# Patient Record
Sex: Male | Born: 1995 | Race: White | Hispanic: No | Marital: Single | Smoking: Current every day smoker
Health system: Southern US, Community
[De-identification: ages and names within clinical notes are randomized; demographics above are authoritative.]

## PROBLEM LIST (undated history)

## (undated) ENCOUNTER — Emergency Department (HOSPITAL_COMMUNITY): Admission: EM | Payer: Managed Care, Other (non HMO) | Source: Home / Self Care

## (undated) DIAGNOSIS — R4689 Other symptoms and signs involving appearance and behavior: Secondary | ICD-10-CM

## (undated) DIAGNOSIS — F909 Attention-deficit hyperactivity disorder, unspecified type: Secondary | ICD-10-CM

## (undated) DIAGNOSIS — R4589 Other symptoms and signs involving emotional state: Secondary | ICD-10-CM

## (undated) DIAGNOSIS — F913 Oppositional defiant disorder: Secondary | ICD-10-CM

## (undated) DIAGNOSIS — J45909 Unspecified asthma, uncomplicated: Secondary | ICD-10-CM

## (undated) HISTORY — DX: Attention-deficit hyperactivity disorder, unspecified type: F90.9

## (undated) HISTORY — PX: OTHER SURGICAL HISTORY: SHX169

## (undated) HISTORY — PX: WRIST SURGERY: SHX841

---
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
## 2017-01-08 ENCOUNTER — Telehealth: Payer: Self-pay | Admitting: Internal Medicine

## 2017-01-08 NOTE — Telephone Encounter (Signed)
Mom called because pt was charged with the  amphetamine-dextroamphetamine (ADDERALL) 10 MG tablet  being in his pocket and not in a labeled bottle.   Pt needs a letter from the dr stating pt is on the adderall and he is allowed to have. Pt still  incarcerated going on 3 weeks, but the adderall was just one problem.  Pt was also charged with missing a court date. But the letter from the dr will help with pt's case.   Please call and mom will pick up.  Pt has court date 01/18/17

## 2017-01-08 NOTE — Telephone Encounter (Signed)
See message below, please advise.

## 2017-01-09 ENCOUNTER — Encounter: Payer: Self-pay | Admitting: Internal Medicine

## 2017-01-09 NOTE — Telephone Encounter (Signed)
Letter dictating okay for her mother to pick up

## 2017-01-09 NOTE — Telephone Encounter (Signed)
Called mother of pt so that she can stop by the office to pick It up.

## 2017-01-12 ENCOUNTER — Ambulatory Visit: Payer: Self-pay | Admitting: Internal Medicine

## 2017-02-15 ENCOUNTER — Telehealth: Payer: Self-pay | Admitting: Internal Medicine

## 2017-02-15 NOTE — Telephone Encounter (Signed)
Pt must schedule an appointment for further refills

## 2017-02-15 NOTE — Telephone Encounter (Signed)
Pt just got out of jail today and girlfriend called requesting refill for  amphetamine-dextroamphetamine (ADDERALL XR) 30 MG 24 hr capsule amphetamine-dextroamphetamine (ADDERALL) 10 MG tablet  Amber, girlfriend states pt will be the one picking up.

## 2017-02-16 ENCOUNTER — Ambulatory Visit: Payer: Medicaid Other | Admitting: Family Medicine

## 2017-02-16 NOTE — Telephone Encounter (Signed)
Pt states he needs this med asap.  appt scheduled with Dr Caryl NeverBurchette this afternoon.

## 2017-02-19 NOTE — Telephone Encounter (Signed)
Pt was late and had to cancel appointment with Dr Caryl NeverBurchette.

## 2017-02-20 ENCOUNTER — Ambulatory Visit: Payer: Self-pay | Admitting: Family Medicine

## 2017-02-20 ENCOUNTER — Ambulatory Visit: Payer: 59 | Admitting: Family Medicine

## 2017-03-06 ENCOUNTER — Ambulatory Visit: Payer: 59 | Admitting: Internal Medicine

## 2017-03-09 ENCOUNTER — Ambulatory Visit: Payer: 59 | Admitting: Internal Medicine

## 2017-03-15 ENCOUNTER — Telehealth: Payer: Self-pay | Admitting: *Deleted

## 2017-03-15 NOTE — Telephone Encounter (Signed)
Left message for return call to office to discuss no show policy and possible dismissal from practice.

## 2017-03-27 ENCOUNTER — Encounter: Payer: Self-pay | Admitting: *Deleted

## 2017-03-27 NOTE — Progress Notes (Signed)
Multiple calls made to number listed for patient, message left with family member for return call to office; no return calls; MD requests patient to be dismissed from practice. Letter mailed to patient.

## 2017-05-25 DIAGNOSIS — Z5321 Procedure and treatment not carried out due to patient leaving prior to being seen by health care provider: Secondary | ICD-10-CM | POA: Diagnosis not present

## 2017-05-25 DIAGNOSIS — M79603 Pain in arm, unspecified: Secondary | ICD-10-CM | POA: Diagnosis present

## 2017-05-25 NOTE — ED Triage Notes (Signed)
Reports shooting up meth yesterday and having pain all over since.  States the pain moves all over and i'm swelling.  No swelling noted.  Small bruise noted to left ac at injection site.  Also states its been hard to pee as well since yesterday.  Reports as burning.

## 2017-05-26 ENCOUNTER — Emergency Department (HOSPITAL_COMMUNITY)
Admission: EM | Admit: 2017-05-26 | Discharge: 2017-05-26 | Disposition: A | Discharge status: Home / Self Care | Payer: 59 | Attending: Emergency Medicine | Admitting: Emergency Medicine

## 2017-05-26 NOTE — ED Notes (Addendum)
Pt called for A2 but no answer in waiting room. Pt moved back to waiting room.

## 2017-07-10 ENCOUNTER — Emergency Department (HOSPITAL_COMMUNITY)
Admission: EM | Admit: 2017-07-10 | Discharge: 2017-07-12 | Disposition: A | Discharge status: Home / Self Care | Payer: 59 | Attending: Emergency Medicine | Admitting: Emergency Medicine

## 2017-07-10 ENCOUNTER — Encounter (HOSPITAL_COMMUNITY): Payer: Self-pay

## 2017-07-10 DIAGNOSIS — Z046 Encounter for general psychiatric examination, requested by authority: Secondary | ICD-10-CM | POA: Insufficient documentation

## 2017-07-10 DIAGNOSIS — F17200 Nicotine dependence, unspecified, uncomplicated: Secondary | ICD-10-CM | POA: Diagnosis not present

## 2017-07-10 DIAGNOSIS — Z813 Family history of other psychoactive substance abuse and dependence: Secondary | ICD-10-CM | POA: Diagnosis not present

## 2017-07-10 DIAGNOSIS — J45909 Unspecified asthma, uncomplicated: Secondary | ICD-10-CM | POA: Insufficient documentation

## 2017-07-10 DIAGNOSIS — F6381 Intermittent explosive disorder: Secondary | ICD-10-CM | POA: Diagnosis not present

## 2017-07-10 DIAGNOSIS — F909 Attention-deficit hyperactivity disorder, unspecified type: Secondary | ICD-10-CM | POA: Insufficient documentation

## 2017-07-10 DIAGNOSIS — F913 Oppositional defiant disorder: Secondary | ICD-10-CM | POA: Diagnosis present

## 2017-07-10 DIAGNOSIS — F1220 Cannabis dependence, uncomplicated: Secondary | ICD-10-CM | POA: Diagnosis not present

## 2017-07-10 DIAGNOSIS — F1994 Other psychoactive substance use, unspecified with psychoactive substance-induced mood disorder: Secondary | ICD-10-CM | POA: Diagnosis not present

## 2017-07-10 LAB — SALICYLATE LEVEL: Salicylate Lvl: <7 mg/dL (ref 2.8–30.0)

## 2017-07-10 LAB — COMPREHENSIVE METABOLIC PANEL
ALK PHOS: 93 U/L (ref 38–126)
ALT: 40 U/L (ref 17–63)
AST: 35 U/L (ref 15–41)
Albumin: 4.7 g/dL (ref 3.5–5.0)
Anion gap: 9 (ref 5–15)
BUN: 8 mg/dL (ref 6–20)
CALCIUM: 9.3 mg/dL (ref 8.9–10.3)
CO2: 28 mmol/L (ref 22–32)
Chloride: 104 mmol/L (ref 101–111)
Creatinine, Ser: 1.11 mg/dL (ref 0.61–1.24)
GFR calc Af Amer: >60 mL/min (ref >60)
GFR calc non Af Amer: >60 mL/min (ref >60)
GLUCOSE: 99 mg/dL (ref 65–99)
Potassium: 3.6 mmol/L (ref 3.5–5.1)
SODIUM: 141 mmol/L (ref 135–145)
Total Bilirubin: 0.6 mg/dL (ref 0.3–1.2)
Total Protein: 7.9 g/dL (ref 6.5–8.1)

## 2017-07-10 LAB — CBC
HEMATOCRIT: 46.6 % (ref 39.0–52.0)
Hemoglobin: 16.2 g/dL (ref 13.0–17.0)
MCH: 32.1 pg (ref 26.0–34.0)
MCHC: 34.8 g/dL (ref 30.0–36.0)
MCV: 92.3 fL (ref 78.0–100.0)
Platelets: 221 10*3/uL (ref 150–400)
RBC: 5.05 MIL/uL (ref 4.22–5.81)
RDW: 13.9 % (ref 11.5–15.5)
WBC: 9.2 10*3/uL (ref 4.0–10.5)

## 2017-07-10 LAB — ETHANOL: Alcohol, Ethyl (B): <5 mg/dL (ref <5)

## 2017-07-10 LAB — RAPID URINE DRUG SCREEN, HOSP PERFORMED
Amphetamines: NOT DETECTED
BARBITURATES: NOT DETECTED
BENZODIAZEPINES: NOT DETECTED
Cocaine: NOT DETECTED
Opiates: NOT DETECTED
TETRAHYDROCANNABINOL: POSITIVE — AB

## 2017-07-10 LAB — ACETAMINOPHEN LEVEL: Acetaminophen (Tylenol), Serum: <10 ug/mL — ABNORMAL LOW (ref 10–30)

## 2017-07-10 MED ORDER — IBUPROFEN 200 MG PO TABS: 600.0000 mg | ORAL_TABLET | Freq: Three times a day (TID) | ORAL | Status: DC | PRN

## 2017-07-10 MED ORDER — NICOTINE 21 MG/24HR TD PT24
21.0000 mg | MEDICATED_PATCH | Freq: Every day | TRANSDERMAL | Status: DC
  Administered 2017-07-10 – 2017-07-12 (×3): 21 mg via TRANSDERMAL
  Filled 2017-07-10 (×3): qty 1

## 2017-07-10 MED ORDER — ALUM & MAG HYDROXIDE-SIMETH 200-200-20 MG/5ML PO SUSP: 30.0000 mL | Freq: Four times a day (QID) | ORAL | Status: DC | PRN
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
## 2017-07-10 NOTE — ED Notes (Signed)
Patient is calm and cooperative-Sheriff Deputy states the patient told him he was depressed. Patient given water and apple juice, declines offer of sandwich.
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
## 2017-07-11 ENCOUNTER — Encounter (HOSPITAL_COMMUNITY): Payer: Self-pay | Admitting: Emergency Medicine

## 2017-07-11 DIAGNOSIS — F1220 Cannabis dependence, uncomplicated: Secondary | ICD-10-CM | POA: Diagnosis present

## 2017-07-11 DIAGNOSIS — F1994 Other psychoactive substance use, unspecified with psychoactive substance-induced mood disorder: Secondary | ICD-10-CM | POA: Diagnosis present

## 2017-07-11 DIAGNOSIS — F6381 Intermittent explosive disorder: Secondary | ICD-10-CM | POA: Diagnosis not present

## 2017-07-11 MED ORDER — GABAPENTIN 300 MG PO CAPS
300.0000 mg | ORAL_CAPSULE | Freq: Two times a day (BID) | ORAL | Status: DC
  Administered 2017-07-11 – 2017-07-12 (×3): 300 mg via ORAL
  Filled 2017-07-11 (×3): qty 1

## 2017-07-11 NOTE — ED Notes (Signed)
Bed: [id number] Expected date:  Expected time:  Means of arrival:  Comments: [facility]er PickerelHall D

## 2017-07-11 NOTE — ED Notes (Signed)
Patient is resting comfortably. 

## 2017-07-11 NOTE — ED Notes (Signed)
Pt denies SI/HI/AVH. Pt complaint with medication regimen. Encouragement and support provided. Special checks q 15 mins in place for safety, Video monitoring in place. Will continue to monitor.

## 2017-07-11 NOTE — ED Notes (Signed)
Pt father at bedside visiting with pt.  

## 2017-07-11 NOTE — ED Notes (Signed)
TTS in progress 

## 2017-07-11 NOTE — ED Notes (Signed)
Patient denies SI, HI and AVH at this time. Plan of care discussed with patient. Patient voices no complaints or concerns at this time. Encouragement and support provided and safety maintain. Q 15 min safety checks in place and video monitoring. 
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
## 2017-07-11 NOTE — ED Notes (Signed)
Transported to [facility]-patient remains calm and cooperative

## 2017-07-11 NOTE — ED Notes (Signed)
Patient in bathroom changing clothes-security called to wand patient and belongings

## 2017-07-12 DIAGNOSIS — F17210 Nicotine dependence, cigarettes, uncomplicated: Secondary | ICD-10-CM | POA: Diagnosis not present

## 2017-07-12 DIAGNOSIS — Z813 Family history of other psychoactive substance abuse and dependence: Secondary | ICD-10-CM

## 2017-07-12 DIAGNOSIS — F6381 Intermittent explosive disorder: Secondary | ICD-10-CM

## 2017-07-12 DIAGNOSIS — F1994 Other psychoactive substance use, unspecified with psychoactive substance-induced mood disorder: Secondary | ICD-10-CM

## 2017-07-12 DIAGNOSIS — R4587 Impulsiveness: Secondary | ICD-10-CM

## 2017-07-12 DIAGNOSIS — F1220 Cannabis dependence, uncomplicated: Secondary | ICD-10-CM | POA: Diagnosis not present

## 2017-07-12 MED ORDER — HYDROCORTISONE 1 % EX CREA
TOPICAL_CREAM | CUTANEOUS | Status: DC | PRN
Start: 2017-07-12 — End: 2017-07-12
  Filled 2017-07-12: qty 28

## 2017-07-12 NOTE — ED Notes (Signed)
Pt d/c home per MD order. Dsicharge summary reviewed with pt. Pt verbalizes understanding.. Pt denies SI/HI/AVH. Personal property returned to pt. Pt ambulatory off unit.

## 2017-07-12 NOTE — ED Notes (Signed)
Pt c/o itching at left foot. Small red area noted, this nurse notified Elta GuadeloupeLaurie Parks NP

## 2017-07-12 NOTE — Consult Note (Signed)
[place] Psychiatry Consult   Reason for Consult:  Aggressive behavior Referring Physician:  EDP Patient Identification: Tony [name] MRN:  [id number] Principal Diagnosis: Intermittent explosive disorder Diagnosis:   Patient Active Problem List   Diagnosis Date Noted  . Intermittent explosive disorder [F63.81] 07/11/2017  . Cannabis use disorder, severe, dependence ([place]) [F12.20] 07/11/2017  . Substance induced mood disorder ([place]) [F19.94] 07/11/2017  . Attention deficit disorder (ADD), child, with hyperactivity [F90.9] 05/02/2011    Total Time spent with patient: 30 minutes  Subjective:   Tony [name] is a 21 y.o. male patient admitted under IVC for aggressive behavior.  HPI:  Pt was seen by Dr Darleene Cleaver and this clinician. Pt spent the night in [facility] without incident. Pt was calm and cooperative, alert & oriented x 4, and appropriate for the situation.  Pt denies suicidal/homicidal ideation, denies auditory/visual hallucinations and does not appear to be responding to internal stimuli. Pt stated he feels good today and was able to contract for safety. Pt is psychiatrically cleared for discharge.   Past Psychiatric History: As above  Risk to Self: None Risk to Others: None Prior Inpatient Therapy: Prior Inpatient Therapy: No Prior Outpatient Therapy: Prior Outpatient Therapy: No Does patient have an ACCT team?: No Does patient have Intensive In-House Services?  : No Does patient have [facility] services? : No Does patient have P4CC services?: No  Past Medical History:  Past Medical History:  Diagnosis Date  . ADHD (attention deficit hyperactivity disorder)   . Asthma   . Attention deficit disorder   . Oppositional defiant disorder   . Suicidal behavior     Past Surgical History:  Procedure Laterality Date  . wisdom teeth removal     . WRIST SURGERY     Family History:  Family History  Problem Relation Age of Onset  . Drug abuse Mother    Family  Psychiatric  History: Unknown Social History:  History  Alcohol Use No     History  Drug Use  . Types: IV, Methamphetamines    Social History   Social History  . Marital status: Single    Spouse name: N/A  . Number of children: N/A  . Years of education: N/A   Social History Main Topics  . Smoking status: Current Every Day Smoker    Packs/day: 0.50    Last attempt to quit: 11/27/2014  . Smokeless tobacco: Never Used  . Alcohol use No  . Drug use: Yes    Types: IV, Methamphetamines  . Sexual activity: No   Other Topics Concern  . None   Social History Narrative  . None   Additional Social History:    Allergies:   Allergies  Allergen Reactions  . Penicillins Anaphylaxis    Has patient had a PCN reaction causing immediate rash, facial/tongue/throat swelling, SOB or lightheadedness with hypotension: YES Has patient had a PCN reaction causing severe rash involving mucus membranes or skin necrosis: NO Has patient had a PCN reaction that required hospitalization NO Has patient had a PCN reaction occurring within the last 10 years: NO If all of the above answers are "NO", then may proceed with Cephalosporin use.  Marland Kitchen Amoxicillin Nausea And Vomiting  . Other     pollen    Labs:  Results for orders placed or performed during the hospital encounter of 07/10/17 (from the past 48 hour(s))  Rapid urine drug screen (hospital performed)     Status: Abnormal   Collection Time: 07/10/17 10:38 PM  Result Value Ref Range   Opiates NONE DETECTED NONE DETECTED   Cocaine NONE DETECTED NONE DETECTED   Benzodiazepines NONE DETECTED NONE DETECTED   Amphetamines NONE DETECTED NONE DETECTED   Tetrahydrocannabinol POSITIVE (A) NONE DETECTED   Barbiturates NONE DETECTED NONE DETECTED    Comment:        DRUG SCREEN FOR MEDICAL PURPOSES ONLY.  IF CONFIRMATION IS NEEDED FOR ANY PURPOSE, NOTIFY LAB WITHIN 5 DAYS.        LOWEST DETECTABLE LIMITS FOR URINE DRUG SCREEN Drug Class        Cutoff (ng/mL) Amphetamine      1000 Barbiturate      200 Benzodiazepine   256 Tricyclics       389 Opiates          300 Cocaine          300 THC              50   Comprehensive metabolic panel     Status: None   Collection Time: 07/10/17 10:48 PM  Result Value Ref Range   Sodium 141 135 - 145 mmol/L   Potassium 3.6 3.5 - 5.1 mmol/L   Chloride 104 101 - 111 mmol/L   CO2 28 22 - 32 mmol/L   Glucose, Bld 99 65 - 99 mg/dL   BUN 8 6 - 20 mg/dL   Creatinine, Ser 1.11 0.61 - 1.24 mg/dL   Calcium 9.3 8.9 - 10.3 mg/dL   Total Protein 7.9 6.5 - 8.1 g/dL   Albumin 4.7 3.5 - 5.0 g/dL   AST 35 15 - 41 U/L   ALT 40 17 - 63 U/L   Alkaline Phosphatase 93 38 - 126 U/L   Total Bilirubin 0.6 0.3 - 1.2 mg/dL   GFR calc non Af Amer >60 >60 mL/min   GFR calc Af Amer >60 >60 mL/min    Comment: (NOTE) The eGFR has been calculated using the CKD EPI equation. This calculation has not been validated in all clinical situations. eGFR's persistently <60 mL/min signify possible Chronic Kidney Disease.    Anion gap 9 5 - 15  cbc     Status: None   Collection Time: 07/10/17 10:48 PM  Result Value Ref Range   WBC 9.2 4.0 - 10.5 K/uL   RBC 5.05 4.22 - 5.81 MIL/uL   Hemoglobin 16.2 13.0 - 17.0 g/dL   HCT 46.6 39.0 - 52.0 %   MCV 92.3 78.0 - 100.0 fL   MCH 32.1 26.0 - 34.0 pg   MCHC 34.8 30.0 - 36.0 g/dL   RDW 13.9 11.5 - 15.5 %   Platelets 221 150 - 400 K/uL  Ethanol     Status: None   Collection Time: 07/10/17 10:49 PM  Result Value Ref Range   Alcohol, Ethyl (B) <5 <5 mg/dL    Comment:        LOWEST DETECTABLE LIMIT FOR SERUM ALCOHOL IS 5 mg/dL FOR MEDICAL PURPOSES ONLY   Salicylate level     Status: None   Collection Time: 07/10/17 10:49 PM  Result Value Ref Range   Salicylate Lvl <3.7 2.8 - 30.0 mg/dL  Acetaminophen level     Status: Abnormal   Collection Time: 07/10/17 10:49 PM  Result Value Ref Range   Acetaminophen (Tylenol), Serum <10 (L) 10 - 30 ug/mL    Comment:         THERAPEUTIC CONCENTRATIONS VARY SIGNIFICANTLY. A RANGE OF 10-30 ug/mL MAY BE AN EFFECTIVE CONCENTRATION FOR  MANY PATIENTS. HOWEVER, SOME ARE BEST TREATED AT CONCENTRATIONS OUTSIDE THIS RANGE. ACETAMINOPHEN CONCENTRATIONS >150 ug/mL AT 4 HOURS AFTER INGESTION AND >50 ug/mL AT 12 HOURS AFTER INGESTION ARE OFTEN ASSOCIATED WITH TOXIC REACTIONS.     Current Facility-Administered Medications  Medication Dose Route Frequency Provider Last Rate Last Dose  . alum & mag hydroxide-simeth (MAALOX/MYLANTA) 200-200-20 MG/5ML suspension 30 mL  30 mL Oral Q6H PRN Muthersbaugh, Hannah, PA-C      . gabapentin (NEURONTIN) capsule 300 mg  300 mg Oral BID Curtisha Bendix, MD   300 mg at 07/12/17 0959  . hydrocortisone cream 1 %   Topical PRN Ethelene Hal, NP      . ibuprofen (ADVIL,MOTRIN) tablet 600 mg  600 mg Oral Q8H PRN Muthersbaugh, Jarrett Soho, PA-C      . nicotine (NICODERM CQ - dosed in mg/24 hours) patch 21 mg  21 mg Transdermal Daily Muthersbaugh, Jarrett Soho, PA-C   21 mg at 07/12/17 1000   Current Outpatient Prescriptions  Medication Sig Dispense Refill  . acyclovir (ZOVIRAX) 400 MG tablet Take 1 tablet (400 mg total) by mouth 5 (five) times daily. (Patient not taking: Reported on 11/30/2016) 30 tablet 2  . amphetamine-dextroamphetamine (ADDERALL XR) 30 MG 24 hr capsule Take 1 capsule (30 mg total) by mouth every morning. (Patient not taking: Reported on 07/10/2017) 30 capsule 0  . amphetamine-dextroamphetamine (ADDERALL) 10 MG tablet Take 1 tablet (10 mg total) by mouth daily with breakfast. (Patient not taking: Reported on 07/10/2017) 30 tablet 0  . benzonatate (TESSALON) 100 MG capsule Take 1 capsule (100 mg total) by mouth 3 (three) times daily as needed for cough (cough). (Patient not taking: Reported on 07/10/2017) 21 capsule 0    Musculoskeletal: Strength & Muscle Tone: within normal limits Gait & Station: normal Patient leans: N/A  Psychiatric Specialty Exam: Physical Exam   Constitutional: He appears well-developed and well-nourished.  Respiratory: Effort normal.  Musculoskeletal: Normal range of motion.  Psychiatric: He has a normal mood and affect. His speech is normal and behavior is normal. Thought content normal. Cognition and memory are normal. He expresses impulsivity.    Review of Systems  Psychiatric/Behavioral: Positive for substance abuse. Negative for depression, hallucinations, memory loss and suicidal ideas. The patient is not nervous/anxious and does not have insomnia.   All other systems reviewed and are negative.   Blood pressure 100/60, pulse 60, temperature 98.7 F (37.1 C), temperature source Oral, resp. rate 18, height '5\' 8"'  (1.727 m), weight 70.8 kg (156 lb), SpO2 98 %.Body mass index is 23.72 kg/m.  General Appearance: Casual  Eye Contact:  Good  Speech:  Clear and Coherent and Normal Rate  Volume:  Normal  Mood:  Euthymic  Affect:  Congruent  Thought Process:  Coherent, Goal Directed and Linear  Orientation:  Full (Time, Place, and Person)  Thought Content:  Logical  Suicidal Thoughts:  No  Homicidal Thoughts:  No  Memory:  Immediate;   Good Recent;   Good Remote;   Fair  Judgement:  Good  Insight:  Lacking  Psychomotor Activity:  Normal  Concentration:  Concentration: Good and Attention Span: Good  Recall:  Good  Fund of Knowledge:  Good  Language:  Good  Akathisia:  No  Handed:  Right  AIMS (if indicated):     Assets:  Agricultural consultant Housing Resilience Social Support  ADL's:  Intact  Cognition:  WNL  Sleep:        Treatment Plan Summary: Plan Intermittent Explosive  Disorder  Discharge Home Follow up with [facility] for medication management and therapy Avoid the use of alcohol and illicit drugs Take all medications as prescribed  Disposition: No evidence of imminent risk to self or others at present.   Patient does not meet criteria for psychiatric inpatient  admission. Discussed crisis plan, support from social network, calling 911, coming to the Emergency Department, and calling Suicide Hotline.  Ethelene Hal, NP 07/12/2017 10:36 AM  Patient seen face-to-face for psychiatric evaluation, chart reviewed and case discussed with the physician extender and developed treatment plan. Reviewed the information documented and agree with the treatment plan. Corena Pilgrim, MD

## 2017-07-12 NOTE — [facility] Suicide Risk Assessment (Signed)
Suicide Risk Assessment  Discharge Assessment   [facility]   Principal Problem: Intermittent explosive disorder Discharge Diagnoses:  Patient Active Problem List   Diagnosis Date Noted  . Intermittent explosive disorder [F63.81] 07/11/2017  . Cannabis use disorder, severe, dependence ([facility]) [F12.20] 07/11/2017  . Substance induced mood disorder ([facility]) [F19.94] 07/11/2017  . Attention deficit disorder (ADD), child, with hyperactivity [F90.9] 05/02/2011    Total Time spent with patient: 30 minutes  Musculoskeletal: Strength & Muscle Tone: within normal limits Gait & Station: normal Patient leans: N/A  Psychiatric Specialty Exam: Physical Exam  Constitutional: He appears well-developed and well-nourished.  Respiratory: Effort normal.  Musculoskeletal: Normal range of motion.  Psychiatric: He has a normal mood and affect. His speech is normal and behavior is normal. Thought content normal. Cognition and memory are normal. He expresses impulsivity.   Review of Systems  Psychiatric/Behavioral: Positive for substance abuse. Negative for depression, hallucinations, memory loss and suicidal ideas. The patient is not nervous/anxious and does not have insomnia.   All other systems reviewed and are negative.  Blood pressure 100/60, pulse 60, temperature 98.7 F (37.1 C), temperature source Oral, resp. rate 18, height 5\' 8"  (1.727 m), weight 70.8 kg (156 lb), SpO2 98 %.Body mass index is 23.72 kg/m. General Appearance: Casual Eye Contact:  Good Speech:  Clear and Coherent and Normal Rate Volume:  Normal Mood:  Euthymic Affect:  Congruent Thought Process:  Coherent, Goal Directed and Linear Orientation:  Full (Time, Place, and Person) Thought Content:  Logical Suicidal Thoughts:  No Homicidal Thoughts:  No Memory:  Immediate;   Good Recent;   Good Remote;   Fair Judgement:  Good Insight:  Lacking Psychomotor Activity:  Normal Concentration:   Concentration: Good and Attention Span: Good Recall:  Good Fund of Knowledge:  Good Language:  Good Akathisia:  No Handed:  Right AIMS (if indicated):    Assets:  ArchitectCommunication Skills Financial Resources/Insurance Housing Resilience Social Support ADL's:  Intact Cognition:  WNL  Mental Status Per Nursing Assessment::   On Admission:     Demographic Factors:  Male, Adolescent or young adult, Caucasian and Low socioeconomic status  Loss Factors: Legal issues and Financial problems/change in socioeconomic status  Historical Factors: Impulsivity  Risk Reduction Factors:   Responsible for children under 19 years of age, Sense of responsibility to family and Living with another person, especially a relative  Continued Clinical Symptoms:  Depression:   Impulsivity Alcohol/Substance Abuse/Dependencies  Cognitive Features That Contribute To Risk:  Closed-mindedness    Suicide Risk:  Minimal: No identifiable suicidal ideation.  Patients presenting with no risk factors but with morbid ruminations; may be classified as minimal risk based on the severity of the depressive symptoms    Plan Of Care/Follow-up recommendations:  Activity:  as tolerated Diet:  Heart Healthy  Tony AbbeLaurie Britton Kamilia Carollo, NP 07/12/2017, 10:43 AM

## 2017-07-12 NOTE — Discharge Instructions (Signed)
For your ongoing mental health needs, you are advised to follow up with [facility].  New and returning patients are seen at their walk-in clinic.  Walk-in hours are Monday - Friday from 8:00 am - 3:00 pm.  Walk-in patients are seen on a first come, first served basis.  Try to arrive as early as possible for he best chance of being seen the same day: ° °     [facility] °     [address] °     [place], [place] [address] °     [phone] °

## 2017-07-12 NOTE — BH Assessment (Signed)
[facility] Assessment Progress Note  Per Thedore MinsMojeed Akintayo, MD, this pt does not require psychiatric hospitalization at this time.  Pt presents under IVC initiated by a person identified on the petition as "Child's father," which Dr Jannifer FranklinAkintayo has rescinded.  [facility].  This has been included in pt's discharge instructions.  Pt's nurse, Morrie Sheldonshley, has been notified.  Doylene Canninghomas Innocence Schlotzhauer, MA Triage Specialist [phone]

## 2019-09-23 ENCOUNTER — Other Ambulatory Visit: Payer: Self-pay

## 2019-09-23 ENCOUNTER — Emergency Department (HOSPITAL_COMMUNITY)
Admission: EM | Admit: 2019-09-23 | Discharge: 2019-09-24 | Disposition: A | Discharge status: Home / Self Care | Payer: Medicaid Other | Attending: Emergency Medicine | Admitting: Emergency Medicine

## 2019-09-23 ENCOUNTER — Encounter (HOSPITAL_COMMUNITY): Payer: Self-pay | Admitting: Emergency Medicine

## 2019-09-23 DIAGNOSIS — F909 Attention-deficit hyperactivity disorder, unspecified type: Secondary | ICD-10-CM | POA: Insufficient documentation

## 2019-09-23 DIAGNOSIS — F1510 Other stimulant abuse, uncomplicated: Secondary | ICD-10-CM | POA: Diagnosis not present

## 2019-09-23 DIAGNOSIS — F1910 Other psychoactive substance abuse, uncomplicated: Secondary | ICD-10-CM | POA: Insufficient documentation

## 2019-09-23 DIAGNOSIS — J45909 Unspecified asthma, uncomplicated: Secondary | ICD-10-CM | POA: Insufficient documentation

## 2019-09-23 DIAGNOSIS — F17210 Nicotine dependence, cigarettes, uncomplicated: Secondary | ICD-10-CM | POA: Diagnosis not present

## 2019-09-23 DIAGNOSIS — K0889 Other specified disorders of teeth and supporting structures: Secondary | ICD-10-CM | POA: Insufficient documentation

## 2019-09-23 NOTE — ED Triage Notes (Signed)
Pt c/o right lower dental pain for months that has gotten worse recently.

## 2019-09-24 MED ORDER — CLINDAMYCIN HCL 150 MG PO CAPS: 150.0000 mg | ORAL_CAPSULE | Freq: Four times a day (QID) | ORAL | 0 refills | Status: DC

## 2019-09-24 MED ORDER — NAPROXEN 500 MG PO TABS: 500.0000 mg | ORAL_TABLET | Freq: Two times a day (BID) | ORAL | 0 refills | Status: DC

## 2019-09-24 MED ORDER — KETOROLAC TROMETHAMINE 30 MG/ML IJ SOLN
30.0000 mg | Freq: Once | INTRAMUSCULAR | Status: AC
  Administered 2019-09-24: 30 mg via INTRAMUSCULAR
  Filled 2019-09-24: qty 1

## 2019-09-24 NOTE — ED Provider Notes (Signed)
[facility] EMERGENCY DEPARTMENT Provider Note   CSN: [id number] Arrival date & time: 09/23/19  2317     History   Chief Complaint Chief Complaint  Patient presents with  . Dental Pain    HPI Tony [name] is a 23 y.o. male.     HPI  This is a 23 year old male with a history of asthma, ADHD, mood disorder who presents with dental pain.  Patient reports several month history of dental pain.  Reports that his right lower premolar has been hurting him on and off for several months.  Over the last several days he has had worsening pain.  He rates his pain 8 out of 10.  He has taken ibuprofen with no relief.  He does not currently have a dentist.  No recent dental procedures.  Denies difficulty swallowing or fevers.  Past Medical History:  Diagnosis Date  . ADHD (attention deficit hyperactivity disorder)   . Asthma   . Attention deficit disorder   . Oppositional defiant disorder   . Suicidal behavior     Patient Active Problem List   Diagnosis Date Noted  . Intermittent explosive disorder 07/11/2017  . Cannabis use disorder, severe, dependence (HCC) 07/11/2017  . Substance induced mood disorder (HCC) 07/11/2017  . Attention deficit disorder (ADD), child, with hyperactivity 05/02/2011    Past Surgical History:  Procedure Laterality Date  . wisdom teeth removal     . WRIST SURGERY          Home Medications    Prior to Admission medications   Medication Sig Start Date End Date Taking? Authorizing Provider  acyclovir (ZOVIRAX) 400 MG tablet Take 1 tablet (400 mg total) by mouth 5 (five) times daily. Patient not taking: Reported on 11/30/2016 01/03/16   Gordy Savers, MD  amphetamine-dextroamphetamine (ADDERALL XR) 30 MG 24 hr capsule Take 1 capsule (30 mg total) by mouth every morning. Patient not taking: Reported on 07/10/2017 09/11/16   Gordy Savers, MD  amphetamine-dextroamphetamine (ADDERALL) 10 MG tablet Take 1 tablet (10 mg total) by mouth daily  with breakfast. Patient not taking: Reported on 07/10/2017 09/11/16   Gordy Savers, MD  benzonatate (TESSALON) 100 MG capsule Take 1 capsule (100 mg total) by mouth 3 (three) times daily as needed for cough (cough). Patient not taking: Reported on 07/10/2017 11/30/16   Doug Sou, MD  clindamycin (CLEOCIN) 150 MG capsule Take 1 capsule (150 mg total) by mouth every 6 (six) hours. 09/24/19   Horton, Mayer Masker, MD  naproxen (NAPROSYN) 500 MG tablet Take 1 tablet (500 mg total) by mouth 2 (two) times daily. 09/24/19   Horton, Mayer Masker, MD    Family History Family History  Problem Relation Age of Onset  . Drug abuse Mother     Social History Social History   Tobacco Use  . Smoking status: Current Every Day Smoker    Packs/day: 0.50    Last attempt to quit: 11/27/2014    Years since quitting: 4.8  . Smokeless tobacco: Never Used  Substance Use Topics  . Alcohol use: No    Alcohol/week: 0.0 standard drinks  . Drug use: Yes    Types: IV, Methamphetamines     Allergies   Penicillins, Amoxicillin, and Other   Review of Systems Review of Systems  Constitutional: Negative for fever.  HENT: Positive for dental problem. Negative for trouble swallowing.   All other systems reviewed and are negative.    Physical Exam Updated Vital Signs BP Marland Kitchen)  150/65 (BP Location: Right Arm)   Pulse 80   Temp 98.4 F (36.9 C) (Oral)   Resp 16   SpO2 95%   Physical Exam Vitals signs and nursing note reviewed.  Constitutional:      Appearance: He is well-developed. He is not ill-appearing.  HENT:     Head: Normocephalic and atraumatic.     Mouth/Throat:     Comments: Multiple dental caries noted, tenderness palpation over the gumline at tooth number #31 Eyes:     Pupils: Pupils are equal, round, and reactive to light.  Neck:     Musculoskeletal: Neck supple.  Cardiovascular:     Rate and Rhythm: Normal rate and regular rhythm.  Pulmonary:     Effort: Pulmonary effort is  normal. No respiratory distress.  Lymphadenopathy:     Cervical: No cervical adenopathy.  Skin:    General: Skin is warm and dry.  Neurological:     Mental Status: He is alert and oriented to person, place, and time.  Psychiatric:        Mood and Affect: Mood normal.      ED Treatments / Results  Labs (all labs ordered are listed, but only abnormal results are displayed) Labs Reviewed - No data to display  EKG None  Radiology No results found.  Procedures Procedures (including critical care time)  Medications Ordered in ED Medications  ketorolac (TORADOL) 30 MG/ML injection 30 mg (has no administration in time range)     Initial Impression / Assessment and Plan / ED Course  I have reviewed the triage vital signs and the nursing notes.  Pertinent labs & imaging results that were available during my care of the patient were reviewed by me and considered in my medical decision making (see chart for details).        Patient presents with dental pain.  No obvious dental abscess.  No evidence of deep space infection.  No evidence of Ludwig's.  Will provide dental resources.  Patient given antibiotics for presumed underlying infection.  Encouraged to take naproxen twice daily for pain.  After history, exam, and medical workup I feel the patient has been appropriately medically screened and is safe for discharge home. Pertinent diagnoses were discussed with the patient. Patient was given return precautions.   Final Clinical Impressions(s) / ED Diagnoses   Final diagnoses:  Pain, dental    ED Discharge Orders         Ordered    clindamycin (CLEOCIN) 150 MG capsule  Every 6 hours     09/24/19 0017    naproxen (NAPROSYN) 500 MG tablet  2 times daily     09/24/19 0017           Horton, Barbette Hair, MD 09/24/19 0020

## 2019-12-28 ENCOUNTER — Emergency Department
Admission: EM | Admit: 2019-12-28 | Discharge: 2019-12-28 | Disposition: A | Discharge status: Home / Self Care | Payer: Medicaid Other | Attending: Student | Admitting: Student

## 2019-12-28 ENCOUNTER — Encounter: Payer: Self-pay | Admitting: Emergency Medicine

## 2019-12-28 ENCOUNTER — Other Ambulatory Visit: Payer: Self-pay

## 2019-12-28 DIAGNOSIS — K029 Dental caries, unspecified: Secondary | ICD-10-CM | POA: Insufficient documentation

## 2019-12-28 DIAGNOSIS — F17200 Nicotine dependence, unspecified, uncomplicated: Secondary | ICD-10-CM | POA: Insufficient documentation

## 2019-12-28 DIAGNOSIS — J45909 Unspecified asthma, uncomplicated: Secondary | ICD-10-CM | POA: Diagnosis not present

## 2019-12-28 DIAGNOSIS — K0889 Other specified disorders of teeth and supporting structures: Secondary | ICD-10-CM | POA: Diagnosis present

## 2019-12-28 MED ORDER — LIDOCAINE-EPINEPHRINE 2 %-1:100000 IJ SOLN
1.7000 mL | Freq: Once | INTRAMUSCULAR | Status: AC
  Administered 2019-12-28: 1.7 mL
  Filled 2019-12-28: qty 1.7

## 2019-12-28 MED ORDER — TRAMADOL HCL 50 MG PO TABS: 50.0000 mg | ORAL_TABLET | Freq: Four times a day (QID) | ORAL | 0 refills | Status: AC | PRN

## 2019-12-28 MED ORDER — CLINDAMYCIN HCL 150 MG PO CAPS: 300.0000 mg | ORAL_CAPSULE | Freq: Three times a day (TID) | ORAL | 0 refills | Status: AC

## 2019-12-28 NOTE — ED Provider Notes (Signed)
[facility] Emergency Department Provider Note  ____________________________________________   First MD Initiated Contact with Patient 12/28/19 [phone]     (approximate)  I have reviewed the triage vital signs and the nursing notes.   HISTORY  Chief Complaint Dental Problem    HPI Tony [name] is a 24 y.o. male presents emergency department complaint right lower tooth pain.  States that pain started a few days ago.  Is taking over-the-counter ibuprofen without any relief.  States he was seen at the family practice dental care but has not had a ride to follow-up.  He denies any fever or chills.  Denies pus or drainage from site.  No neck pain.  No difficulty swallowing.    Past Medical History:  Diagnosis Date  . ADHD (attention deficit hyperactivity disorder)   . Asthma   . Attention deficit disorder   . Oppositional defiant disorder   . Suicidal behavior     Patient Active Problem List   Diagnosis Date Noted  . Intermittent explosive disorder 07/11/2017  . Cannabis use disorder, severe, dependence (HCC) 07/11/2017  . Substance induced mood disorder (HCC) 07/11/2017  . Attention deficit disorder (ADD), child, with hyperactivity 05/02/2011    Past Surgical History:  Procedure Laterality Date  . wisdom teeth removal     . WRIST SURGERY      Prior to Admission medications   Medication Sig Start Date End Date Taking? Authorizing Provider  clindamycin (CLEOCIN) 150 MG capsule Take 2 capsules (300 mg total) by mouth 3 (three) times daily. 12/28/19   Zya Finkle, Roselyn Bering, PA-C  traMADol (ULTRAM) 50 MG tablet Take 1 tablet (50 mg total) by mouth every 6 (six) hours as needed. 12/28/19   Shep Porter, Roselyn Bering, PA-C  amphetamine-dextroamphetamine (ADDERALL) 10 MG tablet Take 1 tablet (10 mg total) by mouth daily with breakfast. Patient not taking: Reported on 07/10/2017 09/11/16 12/28/19  Gordy Savers, MD    Allergies Penicillins, Amoxicillin, and  Other  Family History  Problem Relation Age of Onset  . Drug abuse Mother     Social History Social History   Tobacco Use  . Smoking status: Current Every Day Smoker    Packs/day: 0.50    Last attempt to quit: 11/27/2014    Years since quitting: 5.0  . Smokeless tobacco: Never Used  Substance Use Topics  . Alcohol use: No    Alcohol/week: 0.0 standard drinks  . Drug use: Yes    Types: IV, Methamphetamines    Review of Systems  Constitutional: No fever/chills Eyes: No visual changes. ENT: No sore throat.  Positive dental Respiratory: Denies cough Cardiovascular: Denies chest pain Gastrointestinal: Denies abdominal pain Genitourinary: Negative for dysuria. Musculoskeletal: Negative for back pain. Skin: Negative for rash. Psychiatric: no mood changes,     ____________________________________________   PHYSICAL EXAM:  VITAL SIGNS: ED Triage Vitals  Enc Vitals Group     BP 12/28/19 0808 (!) 156/75     Pulse Rate 12/28/19 0808 (!) 54     Resp 12/28/19 0808 14     Temp 12/28/19 0808 98.1 F (36.7 C)     Temp Source 12/28/19 0808 Oral     SpO2 12/28/19 0808 96 %     Weight 12/28/19 0807 160 lb (72.6 kg)     Height 12/28/19 0807 5\' 10"  (1.778 m)     Head Circumference --      Peak Flow --      Pain Score 12/28/19 0806 10  Pain Loc --      Pain Edu? --      Excl. in GC? --     Constitutional: Alert and oriented. Well appearing and in no acute distress. Eyes: Conjunctivae are normal.  Head: Atraumatic. Nose: No congestion/rhinnorhea. Mouth/Throat: Mucous membranes are moist.  Dental caries noted at the right lower molar #2 pain no gum swelling.  No swelling of the face. Neck:  supple no lymphadenopathy noted Cardiovascular: Normal rate, regular rhythm. Heart sounds are normal Respiratory: Normal respiratory effort.  No retractions, lungs c t a  GU: deferred Musculoskeletal: FROM all extremities, warm and well perfused Neurologic:  Normal speech and  language.  Skin:  Skin is warm, dry and intact. No rash noted. Psychiatric: Mood and affect are normal. Speech and behavior are normal.  ____________________________________________   LABS (all labs ordered are listed, but only abnormal results are displayed)  Labs Reviewed - No data to display ____________________________________________   ____________________________________________  RADIOLOGY    ____________________________________________   PROCEDURES  Procedure(s) performed:   Dental Block  Date/Time: 12/28/2019 10:32 AM Performed by: Faythe Ghee, PA-C Authorized by: Faythe Ghee, PA-C   Consent:    Consent obtained:  Verbal   Consent given by:  Patient   Risks discussed:  Allergic reaction, infection, nerve damage, swelling, hematoma, intravascular injection, pain and unsuccessful block Indications:    Indications: dental pain   Location:    Block type:  Inferior alveolar   Laterality:  Right Procedure details (see MAR for exact dosages):    Syringe type:  Controlled syringe   Needle gauge:  30 G   Anesthetic injected:  Lidocaine 2% WITH epi   Injection procedure:  Anatomic landmarks identified, introduced needle, incremental injection, anatomic landmarks palpated and negative aspiration for blood Post-procedure details:    Outcome:  Anesthesia achieved   Patient tolerance of procedure:  Tolerated well, no immediate complications Comments:     Performed by Sherrie [employer] student Modena Jansky      ____________________________________________   INITIAL IMPRESSION / ASSESSMENT AND PLAN / ED COURSE  Pertinent labs & imaging results that were available during my care of the patient were reviewed by me and considered in my medical decision making (see chart for details).   Patient is 24 year old male presents emergency department dental pain.  See HPI  Physical exam patient appears well.  Right large dental caries noted in the right lower molar.  Findings  were explained to the patient.  He was given a dental block, see procedure note, a prescription for clindamycin and tramadol for pain.  Continue take ibuprofen.  Follow-up with his regular dentist or one of the dental clinics listed on his discharge instructions.  Return if worsening.  States he understands will comply.  Is discharged stable condition.    Tony [name] was evaluated in Emergency Department on 12/28/2019 for the symptoms described in the history of present illness. He was evaluated in the context of the global COVID-19 pandemic, which necessitated consideration that the patient might be at risk for infection with the SARS-CoV-2 virus that causes COVID-19. Institutional protocols and algorithms that pertain to the evaluation of patients at risk for COVID-19 are in a state of rapid change based on information released by regulatory bodies including the CDC and federal and state organizations. These policies and algorithms were followed during the patient's care in the ED.   As part of my medical decision making, I reviewed the following data within the electronic medical  record:  Nursing notes reviewed and incorporated, Old chart reviewed, Notes from prior ED visits and [place] Controlled Substance Database  ____________________________________________   FINAL CLINICAL IMPRESSION(S) / ED DIAGNOSES  Final diagnoses:  Pain due to dental caries      NEW MEDICATIONS STARTED DURING THIS VISIT:  New Prescriptions   CLINDAMYCIN (CLEOCIN) 150 MG CAPSULE    Take 2 capsules (300 mg total) by mouth 3 (three) times daily.   TRAMADOL (ULTRAM) 50 MG TABLET    Take 1 tablet (50 mg total) by mouth every 6 (six) hours as needed.     Note:  This document was prepared using Dragon voice recognition software and may include unintentional dictation errors.    Versie Starks, PA-C 12/28/19 1034    Lilia Pro., MD 12/28/19 [phone]

## 2019-12-28 NOTE — Discharge Instructions (Addendum)
OPTIONS FOR DENTAL FOLLOW UP CARE ° °[place] Department of Health and Human Services - Local Safety Net Dental Clinics °http://www.ncdhhs.gov/dph/oralhealth/services/safetynetclinics.htm °  °[facility] (336-562-3123) ° °[facility] [place] (919-933-9087) ° °[facility] ([phone] ext [phone]) ° °[facility] ([phone]) ° °[facility] ([phone]) °This clinic caters to the indigent population and is on a lottery system. °Location: °[facility], [address], [place] °Clinic Hours: °Wednesdays from 6pm - 9pm, patients seen by a lottery system. °For dates, call or go to www.med.[facility].edu/[facility]/patients/Dental-[facility] °Services: °Cleanings, fillings and simple extractions. °Payment Options: °DENTAL WORK IS FREE OF CHARGE. Bring proof of income or support. °Best way to get seen: °Arrive at 5:15 pm - this is a lottery, NOT first come/first serve, so arriving earlier will not increase your chances of being seen. °  °  °[facility] °[phone] °Select option 1 for emergencies °  °Location: °[facility], [address], [place] °Clinic Hours: °No walk-ins accepted - call the day before to schedule an appointment. °Check in times are 9:30 am and 1:30 pm. °Services: °Simple extractions, temporary fillings, pulpectomy/pulp debridement, uncomplicated abscess drainage. °Payment Options: °PAYMENT IS DUE AT THE TIME OF SERVICE.  Fee is usually $100-200, additional surgical procedures (e.g. abscess drainage) may be extra. °Cash, checks, Visa/MasterCard accepted.  Can file Medicaid if patient is covered for dental - patient should call case worker to check. °No discount for [facility] patients. °Best way to get seen: °MUST call the day before and get onto the schedule. Can usually be seen the next 1-2 days. No walk-ins accepted. °  °  °[facility] °[phone] °   °Location: °[facility], [address], [place] °Clinic Hours: °M, W, Th, F 8am or 1:30pm, Tues 9a or 1:30 - first come/first served. °Services: °Simple extractions, temporary fillings, uncomplicated abscess drainage.  You do not need to be an [place] resident. °Payment Options: °PAYMENT IS DUE AT THE TIME OF SERVICE. °Dental insurance, otherwise sliding scale - bring proof of income or support. °Depending on income and treatment needed, cost is usually $50-200. °Best way to get seen: °Arrive early as it is first come/first served. °  °  °[facility] °[phone] °  °Location: °[address] °Clinic Hours: °Mon-Thu 8a-5p °Services: °Most basic dental services including extractions and fillings. °Payment Options: °PAYMENT IS DUE AT THE TIME OF SERVICE. °Sliding scale, up to 50% off - bring proof if income or support. °Medicaid with dental option accepted. °Best way to get seen: °Call to schedule an appointment, can usually be seen within 2 weeks OR they will try to see walk-ins - show up at 8a or 2p (you may have to wait). °  °  °[facility] °919-245-2435 °[place] RESIDENTS ONLY °  °Location: °[facility], [address], [place], [place] [address] °Clinic Hours: By appointment only. °Monday - Thursday 8am-5pm, Friday 8am-12pm °Services: Cleanings, fillings, extractions. °Payment Options: °PAYMENT IS DUE AT THE TIME OF SERVICE. °Cash, Visa or MasterCard. Sliding scale - $30 minimum per service. °Best way to get seen: °Come in to office, complete packet and make an appointment - need proof of income °or support monies for each household member and proof of [place] residence. °Usually takes about a month to get in. °  °  °[facility] °[phone] °  °Location: °[address]   [place] °Clinic Hours: Walk-in Urgent Care Dental Services are offered Monday-Friday  mornings only. °The numbers of emergencies accepted daily is limited to the number of °providers available. °Maximum 15 - Mondays, Wednesdays & Thursdays °Maximum 10 - Tuesdays & Fridays °Services: °You do not need to be a [place] resident to be seen for a dental emergency. °Emergencies are defined as pain, swelling, abnormal bleeding, or dental trauma. Walkins will receive x-rays if needed. °NOTE: Dental cleaning is not an emergency. °Payment Options: °PAYMENT IS DUE AT THE TIME OF SERVICE. °Minimum co-pay is $40.00 for uninsured patients. °Minimum co-pay is $3.00 for Medicaid with dental coverage. °Dental Insurance is accepted and must be presented at time of visit. °Medicare does not cover dental. °Forms of payment: Cash, credit card, checks. °Best way to get seen: °If not previously registered with the clinic, walk-in dental registration begins at 7:15 am and is on a first come/first serve basis. °If previously registered with the clinic, call to make an appointment. °  °  °[facility] °919-776-4359 °[place] RESIDENTS ONLY °  °Location: °[address], [place], [place] °Clinic Hours: °Mon-Thu 10a-2p °Services: Extractions only! °Payment Options: °FREE (donations accepted) - bring proof of income or support °Best way to get seen: °Call and schedule an appointment OR come at 8am on the 1st Monday of every month (except for holidays) when it is first come/first served. °  °  °[facility] °[phone] °  °Location: °[address], [place] °Clinic Hours: °Friday mornings °Services, Payment Options, Best way to get seen: °Call for info °

## 2019-12-28 NOTE — ED Triage Notes (Signed)
Pt arrived EMS from home for tooth pain.  Headache from tooth pain.  Alert and oriented. NAD.  Pain to right lower tooth.  Handling secretions.

## 2020-02-08 NOTE — ED Provider Notes (Signed)
 [facility] Emergency Department Emergency Department Provider Note  ___________________________________________   This document was created using the aid of voice recognition Dragon dictation software.  Time seen: 02/08/2111:56 PM I have reviewed the triage vital signs and the nursing notes.  History   Chief Complaint Chief Complaint  Patient presents with  . Dental Pain    HPI  Tony [name] is a 24 y.o. male presents via EMS from home with complaint of dental complaint that has been worsening over the past two days. Patient reports constant pain in the right lower jaw and states he chipped a tooth in the right lower jaw. Patient reports associated facial swelling. Patient reports he does not have a dentist that he sees regularly. Patient reports taking ibuprofen  without improvement. He was seen on 12/28/19 at [facility] and received a dental block for same complaint.   Patient denies fever, nausea, vomiting, drainage, difficulty opening/closing his mouth, throat swelling, chest pain, or shortness of breath.   ROS/HISTORY OF PRESENT ILLNESS LIMITED BY: nothing  Additional History: none   History reviewed. No pertinent past medical history.  No past surgical history on file.  No current facility-administered medications for this encounter.  Current Outpatient Medications:  .  doxycycline  hyclate (VIBRAMYCIN ) 100 MG capsule, Take 1 capsule (100 mg total) by mouth 2 times daily., Disp: 10 capsule, Rfl: 0 .  traMADoL  (ULTRAM ) 50 mg tablet, Take 1 tablet (50 mg total) by mouth every 8 (eight) hours as needed for up to 6 doses., Disp: 6 tablet, Rfl: 0  Allergies Penicillins and Tylenol  [acetaminophen ]  History reviewed. No pertinent family history.  Social History Social History   Tobacco Use  . Smoking status: Not on file  Substance Use Topics  . Alcohol use: Not on file  . Drug use: Not on file     Review of Systems Constitutional: Negative for  fever or chills.  Eyes: Negative for visual changes or discharge. ENT: Negative for sore throat, difficulty swallowing or ear pain. Positive for toothache Cardiovascular: Negative for chest pain, palpitations, tachycardia or dyspnea. Musculoskeletal: Negative for myalgias, arthralgias, or back pain. Skin: Negative for rash.   Physical Exam   VITAL SIGNS:    Vitals:   02/08/20 1258  BP: 156/62  Pulse: 82  Temp: 98.2 F (36.8 C)  Resp: 18  SpO2: 98%  PainSc: 10-Ten (severe)  Height: 1.778 m (5' 10)  Weight: 68 kg (150 lb)  BMI (Calculated): 21.6    General appearance: Alert and oriented. Well appearing and in no distress.  Tall thin appearing white male of stated age.  The patient does note that he was seen in [place] for similar circumstances and was given a dental block.  There is no evidence of any dental abscess that is identified at this particular point.  Complains of pain along the right lower mandible area specifically along the areas of teeth 30,31 and 32 Vital signs reviewed. ENT: The patient does not have any other facial swelling.  The patient does have tenderness along the right angle of the mandible.  This appears to be along the dental area of 30 3132.  Intraoral inspection does not identify any obvious abscess or significant gingival swelling.  Patient does have some caries that are noted specifically more so along tooth #18.  There is no other intraoral oral or airway compromise.  There is no submental or sublingular swelling that can be noted Respiratory: Normal respiratory effort. Breath sounds are normal. Cardiovascular: Normal rate, regular  rhythm, no murmurs.     EKG   None  Radiology   All X-rays, CTs, and MRIs interpreted by me and radiologist.  No results found for any visits on 02/08/20.  Labs  No results found for this or any previous visit (from the past 72 hour(s)).    Pertinent labs & imaging results that were available during my care of  the patient were reviewed by me and considered in my medical decision making (see chart for details).  ED Clinical Impression   1. Pain, dental   2. Dental caries     ED Medications:  Medications  ketorolac  (TORADOL ) injection 30 mg (30 mg Intramuscular Given 02/08/20 1336)    ED Course and Medical Decision Making      02/08/2020 1:46 PM      MEDICAL DECISION MAKING  Patient's presentation at this point appears to be indicative of dental pain secondary to really partial tearing of his teeth.  The patient does have a couple of caries that are noted in specific teeth.  We did discuss dental care dental hygiene and dental management moving forward.   ED Disposition    ED Disposition Condition Comment   Discharge Stable [name] discharge to home/self care.              This document serves as a record of services personally performed by Carlin Royden Lou Mickey., MD. It was created on their behalf by Doss Mania, Medical Scribe, a trained medical scribe. The creation of this record is the provider's dictation and/or activities during the visit.   Electronically signed by: Doss Mania, Medical Scribe 02/08/2020 12:56 PM   Documentation assistance provided by the scribe. I was present during the time the encounter was recorded. The information recorded by the scribe was done at my direction and has been reviewed and validated by me. Carlin Royden Lou Mickey., MD 02/08/2020 1:46 PM        Electronically signed by: Carlin Royden Lou Mickey., MD 02/08/20 1346

## 2020-02-08 NOTE — ED Notes (Signed)
 Discharge instructions reviewed with pt. Pt A&Ox4, gait steady to lobby.

## 2021-10-04 NOTE — Progress Notes (Signed)
 [facility] Video Visit   Patient ID:  Tony [name] is a 25 y.o. (DOB [birth year]) male Place of service: patient home Patient has been advised as to the limitations and limited nature of physical exam due to nature of a video visit, the possibility of privacy risk in the use of a video visit, and that the healthcare provider may recommend visiting a healthcare clinic for in-person care and follow up.  Video Visit Assessment and Plan   1. Abscess (Primary) 2. Chronic hepatitis C without hepatic coma (*)    Recommend to the emergency room now for further evaluation more than likely IV antibiotics stat labs pending incision and drainage Patient is frustrated because he fell aches been blown off by the ER in [place]  Recommend going to a [facility] facility emergency room Leita his friend agrees with plan and will take him Patient is aware and agrees with plan Reviewed the importance of being seen further evaluated and treatment due to severity of the situation  Patient verbalized to me that they understood what their problem is, what they need to do about it and why it is important that they do it. I have reviewed the information contained in this note and personally verified its accuracy.  I obtained the history of present illness and personally performed the physical exam Voice recognition software was used in creation of this note . Despite my best efforts at editing the text, some misspellings / errors  may have occurred.    Outpatient Medications as of 10/04/2021:  .  amphetamine -dextroamphetamine  (ADDERALL XR) 25 MG 24 hr capsule, Take one capsule (25 mg dose) by mouth every morning for 30 days. .  amphetamine -dextroamphetamine  (ADDERALL) 10 mg tablet, Take 1 tablet in the afternoon as needed. No current facility-administered medications on file as of 10/04/2021.   Risk, benefits, and alternatives were provided through patient instructions given to the patient electronically  and during the video interaction.  If any worsening symptoms or lack of improvement, the patient will seek immediate medical care.  Video Visit History  No chief complaint on file.    HPI  Patient is here for video visit he is staying with his friend Leita Blackwood Is on the video call as well with patient's consent Patient states a week ago he developed an abscess in the back of his neck he thinks it was an insect bite The abscess is gotten a lot worse since no drainage he is having hard time turning his neck because he is in so much pain he is also had fever chills and sweats  Patient states has been to the emergency room twice But was sent away with no treatment I tried to access records to the hospital in [place]  but was able to access  Patient states the back of his neck is getting worse he is in severe pain    Reviewed and updated this visit by provider: Tobacco  Allergies  Meds  Problems  PDMP        Review of Systems    Video Visit Objective Findings   Examination conducted with the use of video cameras/computer monitors. Vital signs and other aspects of physical exam are limited due to the nature of this encounter.   Physical Exam Constitutional:      General: He is not in acute distress.    Appearance: He is normal weight. He is not ill-appearing, toxic-appearing or diaphoretic.  Skin:    Comments: Large draining abscess in the back  of patient's neck Hard to fully examine on video visit but is extremely large and looks Infected Severe erythema around the abscess  Neurological:     Mental Status: He is alert and oriented to person, place, and time.  Psychiatric:        Mood and Affect: Mood normal.        Behavior: Behavior normal.        Thought Content: Thought content normal.        Judgment: Judgment normal.
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
## 2024-07-07 NOTE — Progress Notes (Signed)
 Scanned document reviewed

## 2024-08-18 NOTE — Progress Notes (Signed)
 Subjective   Patient ID:  Tony [name] is a 28 y.o. (DOB 09-14-1996) male.   Chief Complaint  Patient presents with  . Nasal Congestion    X 5 days  . Cough    X 5 days  . Chest Congestion     HPI  Pt presents c/o congestion and cough x 5 days. Possible fever initially. Some chills. Nasal discharge is greenish. Cough is productive of similar colored sputum. Bad HA's. No n/v/d. No known sick contacts. Tried some OTC meds without significant relief.  Problem List, Past Medical History, Past Surgical History, Past Family History, Social History, Medications, and Allergies, were reviewed and updated as appropriate.   Review of Systems Review of Systems  Constitutional:  Positive for chills and diaphoresis. Negative for activity change, fatigue and fever.  HENT:  Positive for congestion, postnasal drip and sinus pressure. Negative for ear pain, rhinorrhea, sneezing, sore throat and trouble swallowing.   Eyes:  Negative for pain, discharge and itching.  Respiratory:  Negative for cough, chest tightness, shortness of breath and wheezing.   Cardiovascular:  Negative for chest pain.  Gastrointestinal:  Negative for abdominal pain, diarrhea, nausea and vomiting.  Genitourinary:  Negative for dysuria.  Musculoskeletal:  Negative for myalgias, neck pain and neck stiffness.  Skin:  Negative for rash.  Allergic/Immunologic: Negative for environmental allergies and food allergies.  Neurological:  Positive for headaches. Negative for dizziness, weakness and light-headedness.  Hematological:  Negative for adenopathy.     Objective   BP 124/72 (BP Location: Left Upper Arm, Patient Position: Sitting)   Pulse 81   Temp 97.3 F (36.3 C) (Temporal)   Resp 18   Ht 5' 11.5 (1.816 m)   Wt 168 lb 9.6 oz (76.5 kg)   SpO2 96%   BMI 23.19 kg/m   Physical Exam Vitals and nursing note reviewed.  Constitutional:      General: He is not in acute distress.    Appearance: Normal  appearance. He is well-developed. He is not ill-appearing or toxic-appearing.  HENT:     Head: Normocephalic and atraumatic.     Right Ear: Ear canal and external ear normal. A middle ear effusion is present. Tympanic membrane is not erythematous.     Left Ear: Tympanic membrane, ear canal and external ear normal.  No middle ear effusion. Tympanic membrane is not erythematous.     Nose: Congestion present.     Mouth/Throat:     Lips: Pink.     Mouth: Mucous membranes are moist.     Pharynx: Oropharynx is clear. No pharyngeal swelling or posterior oropharyngeal erythema.  Cardiovascular:     Rate and Rhythm: Normal rate and regular rhythm.     Heart sounds: Normal heart sounds, S1 normal and S2 normal. No murmur heard.    No friction rub. No gallop.  Musculoskeletal:     Cervical back: Neck supple.  Pulmonary:     Effort: Pulmonary effort is normal. No respiratory distress.     Breath sounds: Normal breath sounds. Transmitted upper airway sounds present. No decreased breath sounds, wheezing, rhonchi or rales.  Skin:    General: Skin is warm and dry.     Findings: No rash.  Neurological:     General: No focal deficit present.     Mental Status: He is alert and oriented to person, place, and time.  Psychiatric:        Mood and Affect: Mood normal.        Behavior:  Behavior normal.        Thought Content: Thought content normal.        Judgment: Judgment normal.    --------------------------- Labs last 12 hours No results found for this or any previous visit (from the past 12 hours).    Impression   1. Acute bacterial sinusitis  azithromycin  (ZITHROMAX  Z-PAK) 250 mg tablet       Plan  Reassurance. Suspect that pt likely had a viral URI initially, but now it seems to have turned bacterial. Supportive care. Increase fluids. Take Zpak as Rx'd. Ok to continue OTC cold/cough/sinus meds prn. If any worsening or not improving in the next few days, pt advised to call or send  MyChart message.  See encounter after visit summary for patient specific instructions.  I have reviewed the information contained in this note and personally verified its accuracy.  I obtained the history of present illness and personally performed the physical exam.  Patient verbalized to me that they understood what their problem is, what they need to do about it and why it is important that they do it.   Follow up in about 3 weeks (around 09/08/2024) for - see if pt will schedule labs..    No orders of the defined types were placed in this encounter.    Patient's Medications       * Accurate as of August 18, 2024  3:20 PM. Reflects encounter med changes as of last refresh          New Prescriptions      Instructions  azithromycin  250 mg tablet Commonly known as: ZITHROMAX  Z-PAK Started by: Tylene Meres  Take 2 tablets (500 mg) on  Day 1,  followed by 1 tablet (250 mg) once daily on Days 2 through 5.       Continued Medications      Instructions  * amphetamine -dextroamphetamine  25 MG 24 hr capsule Commonly known as: ADDERALL XR  25 mg, Oral, Every morning   * amphetamine -dextroamphetamine  10 mg tablet Commonly known as: ADDERALL  Take 1 tablet in the afternoon as needed.   naproxen  500 mg tablet Commonly known as: NAPROSYN   500 mg, Oral, 2 times a day as needed   traZODone 50 mg tablet Commonly known as: DESYREL  Take 0.5  to 1 tablet at bedtime.      * * This list has 2 medication(s) that are the same as other medications prescribed for you. Read the directions carefully, and ask your doctor or other care provider to review them with you.           Risks, benefits, and alternatives of the medications and treatment plan prescribed today were discussed, and patient expressed understanding. Plan follow-up as discussed or as needed if any worsening symptoms or change in condition.    A yearly health maintenance exam was recommended where  appropriate.     *Some images could not be shown.

## 2024-10-05 ENCOUNTER — Emergency Department (HOSPITAL_BASED_OUTPATIENT_CLINIC_OR_DEPARTMENT_OTHER)
Admission: EM | Admit: 2024-10-05 | Discharge: 2024-10-06 | Disposition: A | Discharge status: Home / Self Care | Attending: Emergency Medicine | Admitting: Emergency Medicine

## 2024-10-05 ENCOUNTER — Encounter (HOSPITAL_BASED_OUTPATIENT_CLINIC_OR_DEPARTMENT_OTHER): Payer: Self-pay

## 2024-10-05 ENCOUNTER — Other Ambulatory Visit: Payer: Self-pay

## 2024-10-05 ENCOUNTER — Emergency Department (HOSPITAL_BASED_OUTPATIENT_CLINIC_OR_DEPARTMENT_OTHER): Admitting: Radiology

## 2024-10-05 DIAGNOSIS — X58XXXA Exposure to other specified factors, initial encounter: Secondary | ICD-10-CM | POA: Diagnosis not present

## 2024-10-05 DIAGNOSIS — R61 Generalized hyperhidrosis: Secondary | ICD-10-CM | POA: Diagnosis not present

## 2024-10-05 DIAGNOSIS — R6889 Other general symptoms and signs: Secondary | ICD-10-CM

## 2024-10-05 DIAGNOSIS — T148XXA Other injury of unspecified body region, initial encounter: Secondary | ICD-10-CM | POA: Diagnosis not present

## 2024-10-05 DIAGNOSIS — R059 Cough, unspecified: Secondary | ICD-10-CM | POA: Diagnosis present

## 2024-10-05 DIAGNOSIS — J111 Influenza due to unidentified influenza virus with other respiratory manifestations: Secondary | ICD-10-CM | POA: Insufficient documentation

## 2024-10-05 DIAGNOSIS — F1193 Opioid use, unspecified with withdrawal: Secondary | ICD-10-CM | POA: Diagnosis not present

## 2024-10-05 LAB — CBC WITH DIFFERENTIAL/PLATELET
Abs Immature Granulocytes: 0.01 K/uL (ref 0.00–0.07)
Basophils Absolute: 0 K/uL (ref 0.0–0.1)
Basophils Relative: 0 %
Eosinophils Absolute: 0 K/uL (ref 0.0–0.5)
Eosinophils Relative: 1 %
HCT: 46.6 % (ref 39.0–52.0)
Hemoglobin: 15.8 g/dL (ref 13.0–17.0)
Immature Granulocytes: 0 %
Lymphocytes Relative: 6 %
Lymphs Abs: 0.2 K/uL — ABNORMAL LOW (ref 0.7–4.0)
MCH: 30.3 pg (ref 26.0–34.0)
MCHC: 33.9 g/dL (ref 30.0–36.0)
MCV: 89.3 fL (ref 80.0–100.0)
Monocytes Absolute: 0.5 K/uL (ref 0.1–1.0)
Monocytes Relative: 13 %
Neutro Abs: 3.5 K/uL (ref 1.7–7.7)
Neutrophils Relative %: 80 %
Platelets: 192 K/uL (ref 150–400)
RBC: 5.22 MIL/uL (ref 4.22–5.81)
RDW: 13.4 % (ref 11.5–15.5)
WBC: 4.3 K/uL (ref 4.0–10.5)
nRBC: 0 % (ref 0.0–0.2)

## 2024-10-05 LAB — URINE DRUG SCREEN
Amphetamines: NEGATIVE
Barbiturates: NEGATIVE
Benzodiazepines: NEGATIVE
Cocaine: NEGATIVE
Fentanyl: POSITIVE — AB
Methadone Scn, Ur: NEGATIVE
Opiates: NEGATIVE
Tetrahydrocannabinol: POSITIVE — AB

## 2024-10-05 LAB — COMPREHENSIVE METABOLIC PANEL WITH GFR
ALT: 30 U/L (ref 0–44)
AST: 41 U/L (ref 15–41)
Albumin: 4.4 g/dL (ref 3.5–5.0)
Alkaline Phosphatase: 95 U/L (ref 38–126)
Anion gap: 13 (ref 5–15)
BUN: 10 mg/dL (ref 6–20)
CO2: 24 mmol/L (ref 22–32)
Calcium: 9.6 mg/dL (ref 8.9–10.3)
Chloride: 100 mmol/L (ref 98–111)
Creatinine, Ser: 0.77 mg/dL (ref 0.61–1.24)
GFR, Estimated: >60 mL/min (ref >60)
Glucose, Bld: 146 mg/dL — ABNORMAL HIGH (ref 70–99)
Potassium: 3.4 mmol/L — ABNORMAL LOW (ref 3.5–5.1)
Sodium: 137 mmol/L (ref 135–145)
Total Bilirubin: 0.6 mg/dL (ref 0.0–1.2)
Total Protein: 7.6 g/dL (ref 6.5–8.1)

## 2024-10-05 LAB — MAGNESIUM: Magnesium: 1.9 mg/dL (ref 1.7–2.4)

## 2024-10-05 LAB — RESP PANEL BY RT-PCR (RSV, FLU A&B, COVID)  RVPGX2
Influenza A by PCR: NEGATIVE
Influenza B by PCR: NEGATIVE
Resp Syncytial Virus by PCR: NEGATIVE
SARS Coronavirus 2 by RT PCR: NEGATIVE

## 2024-10-05 LAB — URINALYSIS, ROUTINE W REFLEX MICROSCOPIC
Bilirubin Urine: NEGATIVE
Glucose, UA: NEGATIVE mg/dL
Hgb urine dipstick: NEGATIVE
Ketones, ur: NEGATIVE mg/dL
Leukocytes,Ua: NEGATIVE
Nitrite: NEGATIVE
Protein, ur: NEGATIVE mg/dL
Specific Gravity, Urine: 1.007 (ref 1.005–1.030)
pH: 6.5 (ref 5.0–8.0)

## 2024-10-05 MED ORDER — IBUPROFEN 800 MG PO TABS
800.0000 mg | ORAL_TABLET | Freq: Once | ORAL | Status: AC
  Administered 2024-10-05: 800 mg via ORAL
  Filled 2024-10-05: qty 1

## 2024-10-05 NOTE — ED Provider Notes (Signed)
 [place] [facility]MENT AT [facility] Provider Note   CSN: [id number] Arrival date & time: 10/05/24  2031     Patient presents with: Withdrawal   Tony [name] is a 28 y.o. male with a history of chronic substance abuse presents to the ED with generalized weakness, bodyaches, subjective fevers that began 2 days ago. Associated symptoms include headache, cough, and congestion. The patient reports no urinary symptoms, nausea, vomiting, diarrhea.  Patient states that he also has large lesions over his entire body that will not go away. Patient states that he last used heroin three days ago however has not used today, patient also states that he is a chronic drug user and usually does not go this long without using. Patient states that he think he may be going through withdrawals, however, he feels worse than he normally does in withdrawal state. Patient states that he has used IV drugs a few years ago, however endorses that he normally snorts heroin. No recent travel. No sick contacts.   HPI     Prior to Admission medications   Medication Sig Start Date End Date Taking? Authorizing Provider  clindamycin  (CLEOCIN ) 150 MG capsule Take 2 capsules (300 mg total) by mouth 3 (three) times daily. 12/28/19   Fisher, Devere ORN, PA-C  traMADol  (ULTRAM ) 50 MG tablet Take 1 tablet (50 mg total) by mouth every 6 (six) hours as needed. 12/28/19   Fisher, Devere ORN, PA-C  amphetamine -dextroamphetamine  (ADDERALL) 10 MG tablet Take 1 tablet (10 mg total) by mouth daily with breakfast. Patient not taking: Reported on 07/10/2017 09/11/16 12/28/19  Jame Maude FALCON, MD    Allergies: Penicillins, Amoxicillin, and Other    Review of Systems  Skin:  Positive for rash.    Updated Vital Signs BP (!) 117/92   Pulse (!) 56   Temp 99.4 F (37.4 C) (Oral)   Resp (!) 23   Ht 5' 8 (1.727 m)   Wt 74.8 kg   SpO2 98%   BMI 25.09 kg/m   Physical Exam Vitals and nursing note reviewed.   Constitutional:      General: He is not in acute distress.    Appearance: Normal appearance. He is ill-appearing and diaphoretic.  HENT:     Head: Normocephalic and atraumatic.  Eyes:     Extraocular Movements: Extraocular movements intact.     Conjunctiva/sclera: Conjunctivae normal.     Pupils: Pupils are equal, round, and reactive to light.  Cardiovascular:     Rate and Rhythm: Normal rate and regular rhythm.     Pulses: Normal pulses.          Radial pulses are 2+ on the right side.     Heart sounds: No murmur heard. Pulmonary:     Effort: Pulmonary effort is normal. No respiratory distress.     Breath sounds: Normal breath sounds.     Comments: Patient has no difficulty speaking complete sentences. Abdominal:     General: Abdomen is flat.     Palpations: Abdomen is soft.     Tenderness: There is no abdominal tenderness.  Musculoskeletal:        General: Normal range of motion.     Cervical back: Normal range of motion.     Right lower leg: No edema.     Left lower leg: No edema.  Lymphadenopathy:     Cervical: No cervical adenopathy.  Skin:    General: Skin is warm.     Capillary Refill: Capillary refill  takes less than 2 seconds.     Comments: Patient has multiple wounds in various healing stages - most notably on his back, right forearm, and right lower extremity. Pictures attached. The wounds are TTP.  Neurological:     General: No focal deficit present.     Mental Status: He is alert. Mental status is at baseline.     GCS: GCS eye subscore is 4. GCS verbal subscore is 5. GCS motor subscore is 6.     Cranial Nerves: Cranial nerves 2-12 are intact.     Sensory: Sensation is intact.     Motor: Motor function is intact.     Coordination: Coordination is intact.  Psychiatric:        Mood and Affect: Mood normal.        Behavior: Behavior is cooperative.          (all labs ordered are listed, but only abnormal results are displayed) Labs Reviewed   COMPREHENSIVE METABOLIC PANEL WITH GFR - Abnormal; Notable for the following components:      Result Value   Potassium 3.4 (*)    Glucose, Bld 146 (*)    All other components within normal limits  CBC WITH DIFFERENTIAL/PLATELET - Abnormal; Notable for the following components:   Lymphs Abs 0.2 (*)    All other components within normal limits  URINE DRUG SCREEN - Abnormal; Notable for the following components:   Tetrahydrocannabinol POSITIVE (*)    Fentanyl POSITIVE (*)    All other components within normal limits  RESP PANEL BY RT-PCR (RSV, FLU A&B, COVID)  RVPGX2  CULTURE, BLOOD (ROUTINE X 2)  CULTURE, BLOOD (ROUTINE X 2)  MAGNESIUM  URINALYSIS, ROUTINE W REFLEX MICROSCOPIC  RAPID HIV SCREEN (HIV 1/2 AB+AG)    EKG: None  Radiology: DG Chest Port 1 View Result Date: 10/06/2024 EXAM: 1 VIEW(S) XRAY OF THE CHEST 10/06/2024 12:11:00 AM COMPARISON: 02/18/2014 CLINICAL HISTORY: Cough FINDINGS: LUNGS AND PLEURA: No focal pulmonary opacity. No pulmonary edema. No pleural effusion. No pneumothorax. HEART AND MEDIASTINUM: No acute abnormality of the cardiac and mediastinal silhouettes. BONES AND SOFT TISSUES: No acute osseous abnormality. IMPRESSION: 1. No acute cardiopulmonary process. Electronically signed by: Pinkie Pebbles MD 10/06/2024 12:22 AM EST RP Workstation: [id number]     Procedures   Medications Ordered in the ED  ibuprofen  (ADVIL ) tablet 800 mg (800 mg Oral Given 10/05/24 2127)  buprenorphine-naloxone (SUBOXONE) 2-0.5 mg per SL tablet 2 tablet (2 tablets Sublingual Given 10/06/24 0047)                                    Medical Decision Making Amount and/or Complexity of Data Reviewed Labs: ordered. Radiology: ordered.  Risk Prescription drug management.   Patient presents to the ED for concern of multiple complaints, this involves an extensive number of treatment options, and is a complaint that carries with it a high risk of complications and morbidity.     The differential diagnosis includes: Infectious etiology Opiate withdrawal  Co morbidities that complicate the patient evaluation: Chronic substance abuse  Additional history obtained: Additional history obtained from Family and Outside Medical Records  External records from outside source obtained and reviewed including medical history, surgical history, medications, allergies. The patient and his mother were reliable historians, providing a clear, detailed, and consistent account of the presenting symptoms and relevant medical history. The information was obtained directly from the patient and his mother  and statements were documented in the patient's own words when possible. No discrepancies were noted between the history provided and available collateral sources.     Lab Tests: I ordered, and personally interpreted labs.   The pertinent results include:  Urine rapid drug screen - positive for THC and fentanyl CMP -no acute findings CBC -no acute findings Urinalysis -no acute findings Respiratory panel -negative Blood cultures -pending HIV screening -negative  Imaging Studies ordered: I ordered imaging studies including: Chest x-ray I independently visualized and interpreted imaging which showed: No acute cardiopulmonary process I agree with the radiologist interpretation  Medicines ordered and prescription drug management: I ordered medications: Ibuprofen  800 for pain Suboxone 2-0.5 mg x 2 for withdrawal symptoms - COWS score 11 Reevaluation of the patient after these medicines showed that the patient improved I have reviewed the patients home medicines and have made adjustments as needed  Test Considered: No additional diagnostic testing was considered based on the patient's presenting symptoms, risk factors, and initial clinical assessment. The approach to diagnostic testing prioritized exclusion of life-threatening conditions  Diagnostic tools:  Clinical decision  tools such as [MDCalc] were used in the emergency department to support diagnostic accuracy, risk stratification, and disposition planning.  COWS Score for Opiate Withdrawal from [email or web address] on 10/05/2024 RESULT SUMMARY: 11 points Mild withdrawal INPUTS: Resting Pulse Rate (BPM) --> 0 = <=80 Sweating --> 3 = Beads of sweat on brow or face Restlessness observation during assessment --> 1 = Reports difficulty sitting still, but is able to do so Pupil size --> 0 = Pupils pinned or normal size for room light Bone or joint aches --> 2 = Patient reports severe diffuse aching of joints/ muscles Runny nose or tearing --> 1 = Nasal stuffiness or unusually moist eyes GI Upset --> 0 = No GI symptoms Tremor observation of outstretched hands --> 0 = No tremor Yawning observation during assessment --> 0 = No yawning Anxiety or irritability --> 1 = Patient reports increasing irritability or anxiousness Gooseflesh skin --> 3 = Piloerection of skin can be felt or hairs standing up on arms  Problem List / ED Course: Problem List: Flu like symptoms  Withdrawal symptoms  Emergency Department Course: The patient presented with multiple complaints. Initial assessment included history, physical exam, and review of prior medical records.  CMP, CBC, respiratory panel completed to rule out infectious or metabolic etiology -no acute findings.  Urinalysis was completed to rule out other infectious etiology -no acute findings.  Urine rapid drug screen was performed to confirm current drug use -positive for THC and fentanyl.  Given patient's physical exam findings and symptoms, rapid HIV screening was completed and found to be negative.  Chest x-ray was obtained and evaluated for additional infectious etiologies and was found to be unremarkable.  Blood cultures were obtained due to skin exam findings of multiple wounds in various healing stages - pending.  Given patient history of reported drug use, patient relaying that he  feels like he may be experiencing withdrawal symptoms, physical exam findings, COWS score 11, and discussions with Dr. Charlyn to determine treatment pathway - Suboxone was administered.  Patient was monitored for 30 minutes after administration for adverse effects -patient tolerated Suboxone well and had improvement with symptoms.  All vitals stayed WNL throughout visit.  Patient was prescribed an antibiotic regimen for healing wounds with a referral to infectious disease for further evaluation.  Patient was also given regimen for withdrawal symptoms with specific instructions for close follow-up to PCP.  Reevaluation: After the interventions noted above, I reevaluated the patient and found that they have :improved.  After 30 minutes, COWS score is 6 and patient is up and walking around in the exam room and notes that he is feeling significantly better.  Social Determinants of Health: Substance Use: Current chronic substance abuse Interventions: Patient educated on available community resources such as [place] [employer].  Dispostion: After consideration of the results and the patients response to treatment, I feel that the patent would benefit from discharge home with antibiotic regimen for wounds, and zofran , clonidine, and Tylenol  for withdrawal symptom management. Also, close follow-up with PCP for withdrawal and suboxone treatment assistance, and infectious disease for wound assessment. Clinical Assessment:    Working diagnosis: Acute withdrawal symptoms, possible infectious etiology pending blood cultures Disposition Plan: The patient is medically stable for discharge from the Emergency Department at this time. Vital signs are within normal limits, and the patient is alert, oriented, and in no acute distress. Evaluation has been completed with no findings necessitating hospital admission or further emergent workup.  Communication:   Patient and his mother informed of disposition decision and rationale.  Questions addressed.  The results and clinical impression were discussed with the patient at bedside and the patient demonstrated understanding.     Final diagnoses:  Withdrawal from opioids (HCC)  Flu-like symptoms  Multiple wounds of skin    ED Discharge Orders          Ordered    cloNIDine (CATAPRES) 0.1 MG tablet  2 times daily        Pending    ondansetron  (ZOFRAN -ODT) 4 MG disintegrating tablet  Every 8 hours PRN        Pending    doxycycline  (VIBRAMYCIN ) 100 MG capsule  2 times daily        Pending               Willma Duwaine CROME, [place] 10/06/24 0131    Charlyn Sora, MD 10/06/24 [phone]

## 2024-10-05 NOTE — ED Provider Notes (Incomplete)
 [place] EMERGENCY DEPARTMENT AT [facility] Provider Note   CSN: [id number] Arrival date & time: 10/05/24  2031     Patient presents with: Withdrawal   Tony [name] is a 28 y.o. male with a history of chronic drug use presents to the ED with generalized weakness, bodyaches, subjective fevers that began 2 days ago. Associated symptoms include headache, cough, and congestion. The patient reports no urinary symptoms, nausea, vomiting, diarrhea.  Patient states that he also has large lesions over his entire body that will not go away. No recent travel. No sick contacts.  Patient states that he snorted heroin 3 days ago however has not used today, patient   Cows score 11   {Add pertinent medical, surgical, social history, OB history to HPI:32947} HPI     Prior to Admission medications   Medication Sig Start Date End Date Taking? Authorizing Provider  clindamycin  (CLEOCIN ) 150 MG capsule Take 2 capsules (300 mg total) by mouth 3 (three) times daily. 12/28/19   Fisher, Devere ORN, PA-C  traMADol  (ULTRAM ) 50 MG tablet Take 1 tablet (50 mg total) by mouth every 6 (six) hours as needed. 12/28/19   Fisher, Devere ORN, PA-C  amphetamine -dextroamphetamine  (ADDERALL) 10 MG tablet Take 1 tablet (10 mg total) by mouth daily with breakfast. Patient not taking: Reported on 07/10/2017 09/11/16 12/28/19  Jame Maude FALCON, MD    Allergies: Penicillins, Amoxicillin, and Other    Review of Systems  Skin:  Positive for rash.    Updated Vital Signs BP (!) 158/68   Pulse 71   Temp 99.4 F (37.4 C) (Oral)   Resp 18   Ht 5' 8 (1.727 m)   Wt 74.8 kg   SpO2 97%   BMI 25.09 kg/m   Physical Exam Vitals and nursing note reviewed.  Constitutional:      General: He is not in acute distress.    Appearance: Normal appearance.  HENT:     Head: Normocephalic and atraumatic.  Eyes:     Extraocular Movements: Extraocular movements intact.     Conjunctiva/sclera: Conjunctivae normal.      Pupils: Pupils are equal, round, and reactive to light.  Cardiovascular:     Rate and Rhythm: Normal rate and regular rhythm.     Pulses: Normal pulses.  Pulmonary:     Effort: Pulmonary effort is normal. No respiratory distress.  Abdominal:     General: Abdomen is flat.     Palpations: Abdomen is soft.     Tenderness: There is no abdominal tenderness.  Musculoskeletal:        General: Normal range of motion.     Cervical back: Normal range of motion.  Skin:    General: Skin is warm and dry.     Capillary Refill: Capillary refill takes less than 2 seconds.  Neurological:     General: No focal deficit present.     Mental Status: He is alert. Mental status is at baseline.  Psychiatric:        Mood and Affect: Mood normal.          (all labs ordered are listed, but only abnormal results are displayed) Labs Reviewed  RESP PANEL BY RT-PCR (RSV, FLU A&B, COVID)  RVPGX2  COMPREHENSIVE METABOLIC PANEL WITH GFR  CBC WITH DIFFERENTIAL/PLATELET  MAGNESIUM  URINALYSIS, ROUTINE W REFLEX MICROSCOPIC  URINE DRUG SCREEN    EKG: None  Radiology: No results found.  {Document cardiac monitor, telemetry assessment procedure when appropriate:32947} Procedures   Medications  Ordered in the ED  ibuprofen  (ADVIL ) tablet 800 mg (has no administration in time range)      {Click here for ABCD2, HEART and other calculators REFRESH Note before signing:1}                              Medical Decision Making Amount and/or Complexity of Data Reviewed Labs: ordered.  Risk Prescription drug management.   Patient presents to the ED for concern of ***, this involves an extensive number of treatment options, and is a complaint that carries with it a high risk of complications and morbidity.    The differential diagnosis includes: *** *** *** ***  Co morbidities that complicate the patient evaluation: *** *** *** ***  Additional history obtained:  Additional history obtained  from *** {Blank multiple:19196::EMS,Family,Nursing,Outside Medical Records,Past Admission}   External records from outside source obtained and reviewed including ***  The patient (or family member) was a **reliable historian**, providing a clear, detailed, and consistent account of the presenting symptoms and relevant medical history. The information was obtained directly from the patient (or family) and statements were documented in the patient's own words when possible. No discrepancies were noted between the history provided and available collateral sources.     Lab Tests: I ordered, and personally interpreted labs.   The pertinent results include:  ***  Imaging Studies ordered: I ordered imaging studies including: *** I independently visualized and interpreted imaging which showed: *** I agree with the radiologist interpretation  Cardiac Monitoring: The patient was maintained on a cardiac monitor.  I personally viewed and interpreted the cardiac monitored which showed an underlying rhythm of: ***  Medicines ordered and prescription drug management: I ordered medications: including ***  for ***  Reevaluation of the patient after these medicines showed that the patient {resolved/improved/worsened:23923::improved} I have reviewed the patients home medicines and have made adjustments as needed  Test Considered: Diagnostic testing was considered based on the patient's presenting symptoms, risk factors, and initial clinical assessment.  For [chest pain] initial evaluation included [ECG and high-sensitivity cardiac troponin (hs-cTn) assays] Given [intermediate] risk, further noninvasive testing such as [coronary CT angiography (CCTA) was considered, with the decision tailored to institutional resources and patient preference.For suspected pulmonary embolism, clinical probability was assessed using validated tools (e.g., Wells score), with D-dimer testing performed in low-risk  cases and CT pulmonary angiography reserved for higher-risk presentations.    Shared decision making was employed to discuss the benefits and risks of diagnostic tests, including potential for risks and benefits to include [incidental findings radiation exposure] Patient values and preferences were incorporated into the decision process.   The approach to diagnostic testing prioritized exclusion of life-threatening conditions  Critical Interventions: ***  Consultations Obtained:   Consultation was obtained from the [specialty] service regarding the evaluation and management of this patient's [primary clinical issue]. The consultant was contacted by phone at [time], and the case was discussed in detail, including relevant history, physical findings, diagnostic results, and the specific clinical question. The consultant recommended [summary of recommendations], which was incorporated into the ongoing care plan. Both teams agreed on the disposition and follow-up needs.    Diagnostic tools:  Clinical decision tools such as [MDCalc] were used in the emergency department to support diagnostic accuracy, risk stratification, and disposition planning.   Problem List / ED Course: Problem List: *** Emergency Department Course: The patient presented with ***. Initial assessment included history, physical exam, and  review of prior medical records. ECG and troponin were ordered and reviewed; both were negative for acute ischemia. Chest X-ray was performed to rule out other causes; no acute findings. Given the patient's risk factors (hypertension, diabetes, hyperlipidemia), the decision was made to monitor in the ED with serial troponins and ECGs.    Differential diagnosis considered included acute coronary syndrome, pulmonary embolism, and musculoskeletal pain. Shared decision making was employed regarding further diagnostic testing and disposition, balancing patient autonomy and network engineer.[1] The  patient was counseled on risks and benefits of admission versus outpatient follow-up.    Consultation with cardiology was obtained; recommendation was for observation and repeat troponin in 3 hours. No acute intervention required at this time. Patient remained hemodynamically stable throughout ED stay.    Reevaluation: After the interventions noted above, I reevaluated the patient and found that they have :{resolved/improved/worsened:23923::improved}  Social Determinants of Health: Patient screened for adverse SDOH domains per ED protocol. Screening included assessment of housing instability, food insecurity, transportation difficulties, and trouble paying for utilities. Housing: Patient reports stable housing; no current risk of homelessness or eviction identified.   Food: Denies food insecurity; able to access sufficient food resources.   Transportation:Reports reliable access to transportation for medical appointments and daily needs.   Pharmacy: No difficulty reported in paying for Rx.    Additional requirement-driven risk factors screened:   Intimate Partner Violence/Exposure to Violence:** Negative screening.   Substance Use: No current substance use disorder identified.   Mental Health: No acute mental health needs; denies suicidal ideation or psychosis.   Interventions: No adverse SDOH identified requiring intervention at this time. Social work consult not indicated. Patient educated on available ED and community resources should future needs arise. Documentation completed in EMR per standardized workflow.   Dispostion: After consideration of the diagnostic results and the patients response to treatment, I feel that the patent would benefit from ***.    {Document critical care time when appropriate  Document review of labs and clinical decision tools ie CHADS2VASC2, etc  Document your independent review of radiology images and any outside records  Document your discussion with  family members, caretakers and with consultants  Document social determinants of health affecting pt's care  Document your decision making why or why not admission, treatments were needed:32947:::1}   Final diagnoses:  None    ED Discharge Orders     None

## 2024-10-05 NOTE — ED Triage Notes (Signed)
 PT to exam 15 via wheelchair c/o generalized weakness x 3 days. Pt states hesniffed heroin 3 days ago and has had fever chills since that time. PT presents with lesion over entire body. PT denies SOB CP, Oral temp 99. 4 VSS PT on room air. IV established.

## 2024-10-05 NOTE — ED Notes (Signed)
 PT requested to have IV removed. IV was clean dry intact. Pt educated on need for IV access and verbalized complete understanding.

## 2024-10-06 ENCOUNTER — Telehealth (HOSPITAL_COMMUNITY): Payer: Self-pay

## 2024-10-06 ENCOUNTER — Emergency Department (HOSPITAL_BASED_OUTPATIENT_CLINIC_OR_DEPARTMENT_OTHER)

## 2024-10-06 LAB — RAPID HIV SCREEN (HIV 1/2 AB+AG)
HIV 1/2 Antibodies: NONREACTIVE
HIV-1 P24 Antigen - HIV24: NONREACTIVE

## 2024-10-06 MED ORDER — CLONIDINE HCL 0.1 MG PO TABS: 0.1000 mg | ORAL_TABLET | Freq: Two times a day (BID) | ORAL | 0 refills | Status: AC

## 2024-10-06 MED ORDER — DOXYCYCLINE HYCLATE 100 MG PO CAPS: 100.0000 mg | ORAL_CAPSULE | Freq: Two times a day (BID) | ORAL | 0 refills | Status: AC

## 2024-10-06 MED ORDER — ONDANSETRON 4 MG PO TBDP: 4.0000 mg | ORAL_TABLET | Freq: Three times a day (TID) | ORAL | 0 refills | Status: AC | PRN

## 2024-10-06 MED ORDER — BUPRENORPHINE HCL-NALOXONE HCL 2-0.5 MG SL SUBL
2.0000 | SUBLINGUAL_TABLET | Freq: Once | SUBLINGUAL | Status: AC
  Administered 2024-10-06: 2 via SUBLINGUAL
  Filled 2024-10-06: qty 2

## 2024-10-06 NOTE — Telephone Encounter (Cosign Needed)
 Prescriptions at the time of visit yesterday evening were marked as pending upon discharge, I was contacted by T. Chrismon who stated that patient was unable to pick up his prescriptions.  Prescriptions were reordered, patient was contacted by phone to let him know that prescriptions were sent over, and pharmacy was confirmed during phone call.

## 2024-10-06 NOTE — Discharge Instructions (Addendum)
 Thank you for visiting the Emergency Department today.  It was a pleasure to be part of your healthcare team.  You were treated for acute withdrawal symptoms. You have been treated with Suboxone during this visit, you were prescribed an antibiotic regimen, and additional medications to help with withdrawal symptoms.  You should take your medications as directed. If you have any questions about your medicines, please call your pharmacy or healthcare provider. Like we discussed, it is important to follow-up with your PCP and infectious disease for further regarding your care this visit. It is important to watch for warning signs such as worsening pain, fever, trouble breathing, chest pain. If any of these happen, return to the Emergency Department or call 911. Thank you for trusting us  with your health.
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
## 2024-10-07 LAB — BLOOD CULTURE ID PANEL (REFLEXED) - BCID2

## 2024-10-09 LAB — CULTURE, BLOOD (ROUTINE X 2): Special Requests: ADEQUATE

## 2024-10-11 LAB — CULTURE, BLOOD (ROUTINE X 2): Culture: NO GROWTH

## 2024-11-17 ENCOUNTER — Emergency Department (HOSPITAL_BASED_OUTPATIENT_CLINIC_OR_DEPARTMENT_OTHER)
Admission: EM | Admit: 2024-11-17 | Discharge: 2024-11-17 | Disposition: A | Discharge status: Home / Self Care | Attending: Emergency Medicine | Admitting: Emergency Medicine

## 2024-11-17 ENCOUNTER — Other Ambulatory Visit: Payer: Self-pay

## 2024-11-17 DIAGNOSIS — J111 Influenza due to unidentified influenza virus with other respiratory manifestations: Secondary | ICD-10-CM | POA: Diagnosis not present

## 2024-11-17 DIAGNOSIS — R509 Fever, unspecified: Secondary | ICD-10-CM | POA: Diagnosis present

## 2024-11-17 DIAGNOSIS — Z5321 Procedure and treatment not carried out due to patient leaving prior to being seen by health care provider: Secondary | ICD-10-CM | POA: Diagnosis not present

## 2024-11-17 LAB — RESP PANEL BY RT-PCR (RSV, FLU A&B, COVID)  RVPGX2
Influenza A by PCR: NEGATIVE
Influenza B by PCR: NEGATIVE
Resp Syncytial Virus by PCR: NEGATIVE
SARS Coronavirus 2 by RT PCR: NEGATIVE

## 2024-11-17 NOTE — ED Triage Notes (Signed)
 Pt c/o n/v/s, chills, fever, productive cough and fatigue since this morning.  Pt took ibuprofen  at approx 7a today. Reports fever at home of 102F.  AAOx4 in triage, NAD.
